# Patient Record
Sex: Female | Born: 1975 | Race: Black or African American | Hispanic: No | Marital: Married | Smoking: Never smoker
Health system: Southern US, Community
[De-identification: ages and names within clinical notes are randomized; demographics above are authoritative.]

## PROBLEM LIST (undated history)

## (undated) DIAGNOSIS — M549 Dorsalgia, unspecified: Secondary | ICD-10-CM

## (undated) HISTORY — PX: ABDOMINAL HYSTERECTOMY: SHX81

---

## 2018-07-31 ENCOUNTER — Emergency Department
Admission: EM | Admit: 2018-07-31 | Discharge: 2018-07-31 | Disposition: A | Discharge status: Home / Self Care | Payer: Managed Care, Other (non HMO) | Attending: Emergency Medicine | Admitting: Emergency Medicine

## 2018-07-31 ENCOUNTER — Emergency Department: Payer: Managed Care, Other (non HMO)

## 2018-07-31 ENCOUNTER — Other Ambulatory Visit: Payer: Self-pay

## 2018-07-31 DIAGNOSIS — Y9389 Activity, other specified: Secondary | ICD-10-CM | POA: Insufficient documentation

## 2018-07-31 DIAGNOSIS — R1010 Upper abdominal pain, unspecified: Secondary | ICD-10-CM | POA: Insufficient documentation

## 2018-07-31 DIAGNOSIS — R0789 Other chest pain: Secondary | ICD-10-CM | POA: Insufficient documentation

## 2018-07-31 DIAGNOSIS — Y92410 Unspecified street and highway as the place of occurrence of the external cause: Secondary | ICD-10-CM | POA: Insufficient documentation

## 2018-07-31 DIAGNOSIS — Y999 Unspecified external cause status: Secondary | ICD-10-CM | POA: Diagnosis not present

## 2018-07-31 HISTORY — DX: Dorsalgia, unspecified: M54.9

## 2018-07-31 LAB — COMPREHENSIVE METABOLIC PANEL
ALT: 13 U/L (ref 0–44)
AST: 18 U/L (ref 15–41)
Albumin: 3.6 g/dL (ref 3.5–5.0)
Alkaline Phosphatase: 53 U/L (ref 38–126)
Anion gap: 7 (ref 5–15)
BUN: 6 mg/dL (ref 6–20)
CHLORIDE: 108 mmol/L (ref 98–111)
CO2: 24 mmol/L (ref 22–32)
CREATININE: 0.59 mg/dL (ref 0.44–1.00)
Calcium: 8.4 mg/dL — ABNORMAL LOW (ref 8.9–10.3)
GFR calc Af Amer: >60 mL/min (ref >60)
GFR calc non Af Amer: >60 mL/min (ref >60)
Glucose, Bld: 94 mg/dL (ref 70–99)
Potassium: 3.6 mmol/L (ref 3.5–5.1)
SODIUM: 139 mmol/L (ref 135–145)
Total Bilirubin: 0.4 mg/dL (ref 0.3–1.2)
Total Protein: 6.7 g/dL (ref 6.5–8.1)

## 2018-07-31 LAB — CBC
HCT: 33.2 % — ABNORMAL LOW (ref 36.0–46.0)
HEMOGLOBIN: 10.8 g/dL — AB (ref 12.0–15.0)
MCH: 31.1 pg (ref 26.0–34.0)
MCHC: 32.5 g/dL (ref 30.0–36.0)
MCV: 95.7 fL (ref 80.0–100.0)
NRBC: 0 % (ref 0.0–0.2)
PLATELETS: 313 10*3/uL (ref 150–400)
RBC: 3.47 MIL/uL — AB (ref 3.87–5.11)
RDW: 13.2 % (ref 11.5–15.5)
WBC: 7.1 10*3/uL (ref 4.0–10.5)

## 2018-07-31 MED ORDER — IBUPROFEN 600 MG PO TABS: 600.0000 mg | ORAL_TABLET | Freq: Four times a day (QID) | ORAL | 0 refills | Status: AC | PRN

## 2018-07-31 MED ORDER — CYCLOBENZAPRINE HCL 5 MG PO TABS: ORAL_TABLET | ORAL | 0 refills | Status: AC

## 2018-07-31 MED ORDER — IOPAMIDOL (ISOVUE-300) INJECTION 61%
100.0000 mL | Freq: Once | INTRAVENOUS | Status: AC | PRN
  Administered 2018-07-31: 100 mL via INTRAVENOUS
  Filled 2018-07-31: qty 100

## 2018-07-31 NOTE — ED Notes (Signed)
Patient c/o peri-umbilical tenderness with light palpation.
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
